# Patient Record
Sex: Male | Born: 1980 | Race: Black or African American | Hispanic: No | Marital: Single | Smoking: Current every day smoker
Health system: Southern US, Community
[De-identification: ages and names within clinical notes are randomized; demographics above are authoritative.]

## PROBLEM LIST (undated history)

## (undated) DIAGNOSIS — K222 Esophageal obstruction: Secondary | ICD-10-CM

## (undated) HISTORY — PX: ESOPHAGEAL DILATION: SHX303

## (undated) HISTORY — PX: MANDIBLE FRACTURE SURGERY: SHX706

---

## 2011-10-14 ENCOUNTER — Emergency Department (INDEPENDENT_AMBULATORY_CARE_PROVIDER_SITE_OTHER): Payer: Managed Care, Other (non HMO)

## 2011-10-14 ENCOUNTER — Emergency Department (HOSPITAL_BASED_OUTPATIENT_CLINIC_OR_DEPARTMENT_OTHER)
Admission: EM | Admit: 2011-10-14 | Discharge: 2011-10-15 | Disposition: A | Discharge status: Home / Self Care | Payer: Managed Care, Other (non HMO) | Attending: Emergency Medicine | Admitting: Emergency Medicine

## 2011-10-14 ENCOUNTER — Encounter (HOSPITAL_BASED_OUTPATIENT_CLINIC_OR_DEPARTMENT_OTHER): Payer: Self-pay | Admitting: *Deleted

## 2011-10-14 DIAGNOSIS — R1110 Vomiting, unspecified: Secondary | ICD-10-CM | POA: Insufficient documentation

## 2011-10-14 DIAGNOSIS — R6889 Other general symptoms and signs: Secondary | ICD-10-CM | POA: Insufficient documentation

## 2011-10-14 DIAGNOSIS — J00 Acute nasopharyngitis [common cold]: Secondary | ICD-10-CM

## 2011-10-14 DIAGNOSIS — R059 Cough, unspecified: Secondary | ICD-10-CM | POA: Insufficient documentation

## 2011-10-14 DIAGNOSIS — J3489 Other specified disorders of nose and nasal sinuses: Secondary | ICD-10-CM | POA: Insufficient documentation

## 2011-10-14 DIAGNOSIS — F17200 Nicotine dependence, unspecified, uncomplicated: Secondary | ICD-10-CM | POA: Insufficient documentation

## 2011-10-14 DIAGNOSIS — J069 Acute upper respiratory infection, unspecified: Secondary | ICD-10-CM

## 2011-10-14 DIAGNOSIS — R05 Cough: Secondary | ICD-10-CM | POA: Insufficient documentation

## 2011-10-14 DIAGNOSIS — J45901 Unspecified asthma with (acute) exacerbation: Secondary | ICD-10-CM | POA: Insufficient documentation

## 2011-10-14 DIAGNOSIS — R0602 Shortness of breath: Secondary | ICD-10-CM

## 2011-10-14 MED ORDER — PREDNISONE 50 MG PO TABS
60.0000 mg | ORAL_TABLET | Freq: Once | ORAL | Status: AC
  Administered 2011-10-15: 60 mg via ORAL
  Filled 2011-10-14: qty 1

## 2011-10-14 MED ORDER — ALBUTEROL SULFATE HFA 108 (90 BASE) MCG/ACT IN AERS: 1.0000 | INHALATION_SPRAY | Freq: Four times a day (QID) | RESPIRATORY_TRACT | Status: AC | PRN

## 2011-10-14 MED ORDER — ALBUTEROL SULFATE (5 MG/ML) 0.5% IN NEBU
5.0000 mg | INHALATION_SOLUTION | Freq: Once | RESPIRATORY_TRACT | Status: AC
  Administered 2011-10-14: 5 mg via RESPIRATORY_TRACT
  Filled 2011-10-14: qty 1

## 2011-10-14 NOTE — Discharge Instructions (Signed)
Asthma, Adult Asthma is caused by narrowing of the air passages in the lungs. It may be triggered by pollen, dust, animal dander, molds, some foods, respiratory infections, exposure to smoke, exercise, emotional stress or other allergens (things that cause allergic reactions or allergies). Repeat attacks are common. HOME CARE INSTRUCTIONS   Use prescription medications as ordered by your caregiver.   Avoid pollen, dust, animal dander, molds, smoke and other things that cause attacks at home and at work.   You may have fewer attacks if you decrease dust in your home. Electrostatic air cleaners may help.   It may help to replace your pillows or mattress with materials less likely to cause allergies.   Talk to your caregiver about an action plan for managing asthma attacks at home, including, the use of a peak flow meter which measures the severity of your asthma attack. An action plan can help minimize or stop the attack without having to seek medical care.   If you are not on a fluid restriction, drink 8 to 10 glasses of water each day.   Always have a plan prepared for seeking medical attention, including, calling your physician, accessing local emergency care, and calling 911 (in the [place] for a severe attack.   Discuss possible exercise routines with your caregiver.   If animal dander is the cause of asthma, you may need to get rid of pets.  SEEK MEDICAL CARE IF:   You have wheezing and shortness of breath even if taking medicine to prevent attacks.   You have muscle aches, chest pain or thickening of sputum.   Your sputum changes from clear or white to yellow, green, gray, or bloody.   You have any problems that may be related to the medicine you are taking (such as a rash, itching, swelling or trouble breathing).  SEEK IMMEDIATE MEDICAL CARE IF:   Your usual medicines do not stop your wheezing or there is increased coughing and/or shortness of breath.   You have increased  difficulty breathing.   You have a fever.  MAKE SURE YOU:   Understand these instructions.   Will watch your condition.   Will get help right away if you are not doing well or get worse.  Document Released: 08/07/2005 Document Revised: 04/19/2011 Document Reviewed: 03/25/2008 [facility] Patient Information 2012 [place], [place].Upper Respiratory Infection, Adult An upper respiratory infection (URI) is also known as the common cold. It is often caused by a type of germ (virus). Colds are easily spread (contagious). You can pass it to others by kissing, coughing, sneezing, or drinking out of the same glass. Usually, you get better in 1 or 2 weeks.  HOME CARE   Only take medicine as told by your doctor.   Use a warm mist humidifier or breathe in steam from a hot shower.   Drink enough water and fluids to keep your pee (urine) clear or pale yellow.   Get plenty of rest.   Return to work when your temperature is back to normal or as told by your doctor. You may use a face mask and wash your hands to stop your cold from spreading.  GET HELP RIGHT AWAY IF:   After the first few days, you feel you are getting worse.   You have questions about your medicine.   You have chills, shortness of breath, or brown or red spit (mucus).   You have yellow or brown snot (nasal discharge) or pain in the face, especially when you bend  forward.   You have a fever, puffy (swollen) neck, pain when you swallow, or white spots in the back of your throat.   You have a bad headache, ear pain, sinus pain, or chest pain.   You have a high-pitched whistling sound when you breathe in and out (wheezing).   You have a lasting cough or cough up blood.   You have sore muscles or a stiff neck.  MAKE SURE YOU:   Understand these instructions.   Will watch your condition.   Will get help right away if you are not doing well or get worse.  Document Released: 01/24/2008 Document Revised: 04/19/2011 Document  Reviewed: 12/12/2010 [facility] Patient Information 2012 [place], [place].

## 2011-10-14 NOTE — ED Notes (Signed)
Pt presents to ED today with URI sx for the last 2 weeks.  Pt was seen by PMD and dx with sinus infection and URI.  Pt here for asthma sx.

## 2011-10-14 NOTE — ED Provider Notes (Signed)
History     CSN: [id number]  Arrival date & time 10/14/11  2306   First MD Initiated Contact with Patient 10/14/11 2345      Chief Complaint  Patient presents with  . URI    (Consider location/radiation/quality/duration/timing/severity/associated sxs/prior treatment) HPI Comments: Patient presents today with a two-week history of runny nose cough and congestion. He started running fevers 2 days ago. He's also had some increased wheezing and trouble with his asthma over the last few days. He was seen at an urgent care center earlier today and diagnosed with a sinus infection and upper respiratory infection was given prescriptions for amoxicillin and, Robitussin a.c., and prednisone. However he has not started any of these medicines yet. He comes in tonight with increased shortness of breath and wheezing related to his asthma. He did use 2 puffs of his inhaler prior to arrival he actually feeling better at this point. Denies any chest pain. He has had some posttussive emesis. He has cough productive of white-yellow sputum.  The history is provided by the patient.    Past Medical History  Diagnosis Date  . Asthma     History reviewed. No pertinent past surgical history.  History reviewed. No pertinent family history.  History  Substance Use Topics  . Smoking status: Current Everyday Smoker -- 0.5 packs/day  . Smokeless tobacco: Not on file  . Alcohol Use: 0.0 oz/week    1-2 Cans of beer per week      Review of Systems  Constitutional: Positive for fever and fatigue. Negative for chills and diaphoresis.  HENT: Positive for congestion, rhinorrhea and sneezing.   Eyes: Negative.   Respiratory: Positive for cough and shortness of breath. Negative for chest tightness.   Cardiovascular: Negative for chest pain and leg swelling.  Gastrointestinal: Negative for nausea, vomiting, abdominal pain, diarrhea and blood in stool.  Genitourinary: Negative for frequency, hematuria, flank  pain and difficulty urinating.  Musculoskeletal: Negative for back pain and arthralgias.  Skin: Negative for rash.  Neurological: Negative for dizziness, speech difficulty, weakness, numbness and headaches.    Allergies  Review of patient's allergies indicates no known allergies.  Home Medications   Current Outpatient Rx  Name Route Sig Dispense Refill  . ALBUTEROL SULFATE HFA 108 (90 BASE) MCG/ACT IN AERS Inhalation Inhale 2 puffs into the lungs every 6 (six) hours as needed. For shortness of breath or wheezing     . AMOXICILLIN 875 MG PO TABS Oral Take 875 mg by mouth 2 (two) times daily. For 10 days starting 10/15/2011    . GUAIFENESIN-CODEINE 100-10 MG/5ML PO SYRP Oral Take 5 mLs by mouth every 6 (six) hours as needed. For cough     . PREDNISONE 10 MG PO TABS Oral Take 10 mg by mouth 3 (three) times daily. For 5 days starting 10/15/2011    . ALBUTEROL SULFATE HFA 108 (90 BASE) MCG/ACT IN AERS Inhalation Inhale 1-2 puffs into the lungs every 6 (six) hours as needed for wheezing. 1 Inhaler 0    BP 129/71  Pulse 103  Temp(Src) 98.5 F (36.9 C) (Oral)  Resp 19  Ht 5\' 8"  (1.727 m)  Wt 138 lb (62.596 kg)  BMI 20.98 kg/m2  SpO2 100%  Physical Exam  Constitutional: He is oriented to person, place, and time. He appears well-developed and well-nourished.  HENT:  Head: Normocephalic and atraumatic.  Right Ear: External ear normal.  Left Ear: External ear normal.  Mouth/Throat: Oropharynx is clear and moist.  Rhinorrhea present  Eyes: Pupils are equal, round, and reactive to light.  Neck: Normal range of motion. Neck supple.  Cardiovascular: Normal rate, regular rhythm and normal heart sounds.   Pulmonary/Chest: Effort normal. No respiratory distress. He has wheezes. He has no rales. He exhibits no tenderness.       Mildly decreased breath sounds bilaterally with end expiratory wheezes. No accessory muscle use is noted. Patient is talking in full sentences.  Abdominal: Soft.  Bowel sounds are normal. There is no tenderness. There is no rebound and no guarding.  Musculoskeletal: Normal range of motion. He exhibits no edema.  Lymphadenopathy:    He has no cervical adenopathy.  Neurological: He is alert and oriented to person, place, and time.  Skin: Skin is warm and dry. No rash noted.  Psychiatric: He has a normal mood and affect.    ED Course  Procedures (including critical care time)  Labs Reviewed - No data to display Dg Chest 2 View  10/14/2011  *RADIOLOGY REPORT*  Clinical Data: 31 year old male with cough, cold, shortness of breath.  URI.  CHEST - 2 VIEW  Comparison: None.  Findings: Normal lung volumes.  Cardiac size and mediastinal contours are within normal limits.  Visualized tracheal air column is within normal limits.  The lungs are clear except for mild diffuse increased interstitial markings.  No effusion. No acute osseous abnormality identified.  IMPRESSION: Negative except for mild diffuse increased interstitial markings such that viral / atypical respiratory infection is difficult to exclude.  Original Report Authenticated By: Ulla Potash III, M.D.     1. Asthma exacerbation   2. URI (upper respiratory infection)       MDM  Patient is well-appearing with no evidence of respiratory distress. There is no evidence of pneumonia. Although there is some increased interstitial markings which could represent atypical infection but is more likely viral. Patient was given one nebulizer treatment here as well as a dose of prednisone. He was advised to start taking the prescriptions as prescribed by the urgent care. He was advised to return here for any worsening symptoms or also encouraged to be seen if he has no improvement within the next few days        Rolan Bucco, MD 10/15/11 0001

## 2011-10-14 NOTE — ED Notes (Signed)
Pt has an hx of asthma and has been sick with a cold and takes MDI Albuterol and steroids but presented with [facility] and has a stuffy nose. Pt is clear bilaterally and is in no distress.

## 2013-01-30 IMAGING — CR DG CHEST 2V
2 series · 2 of 2 positions shown · non-contrast
Comparison: None.

CLINICAL DATA: 30-year-old male with cough, cold, shortness of
breath.  URI.

CHEST - 2 VIEW

[w chest pa]
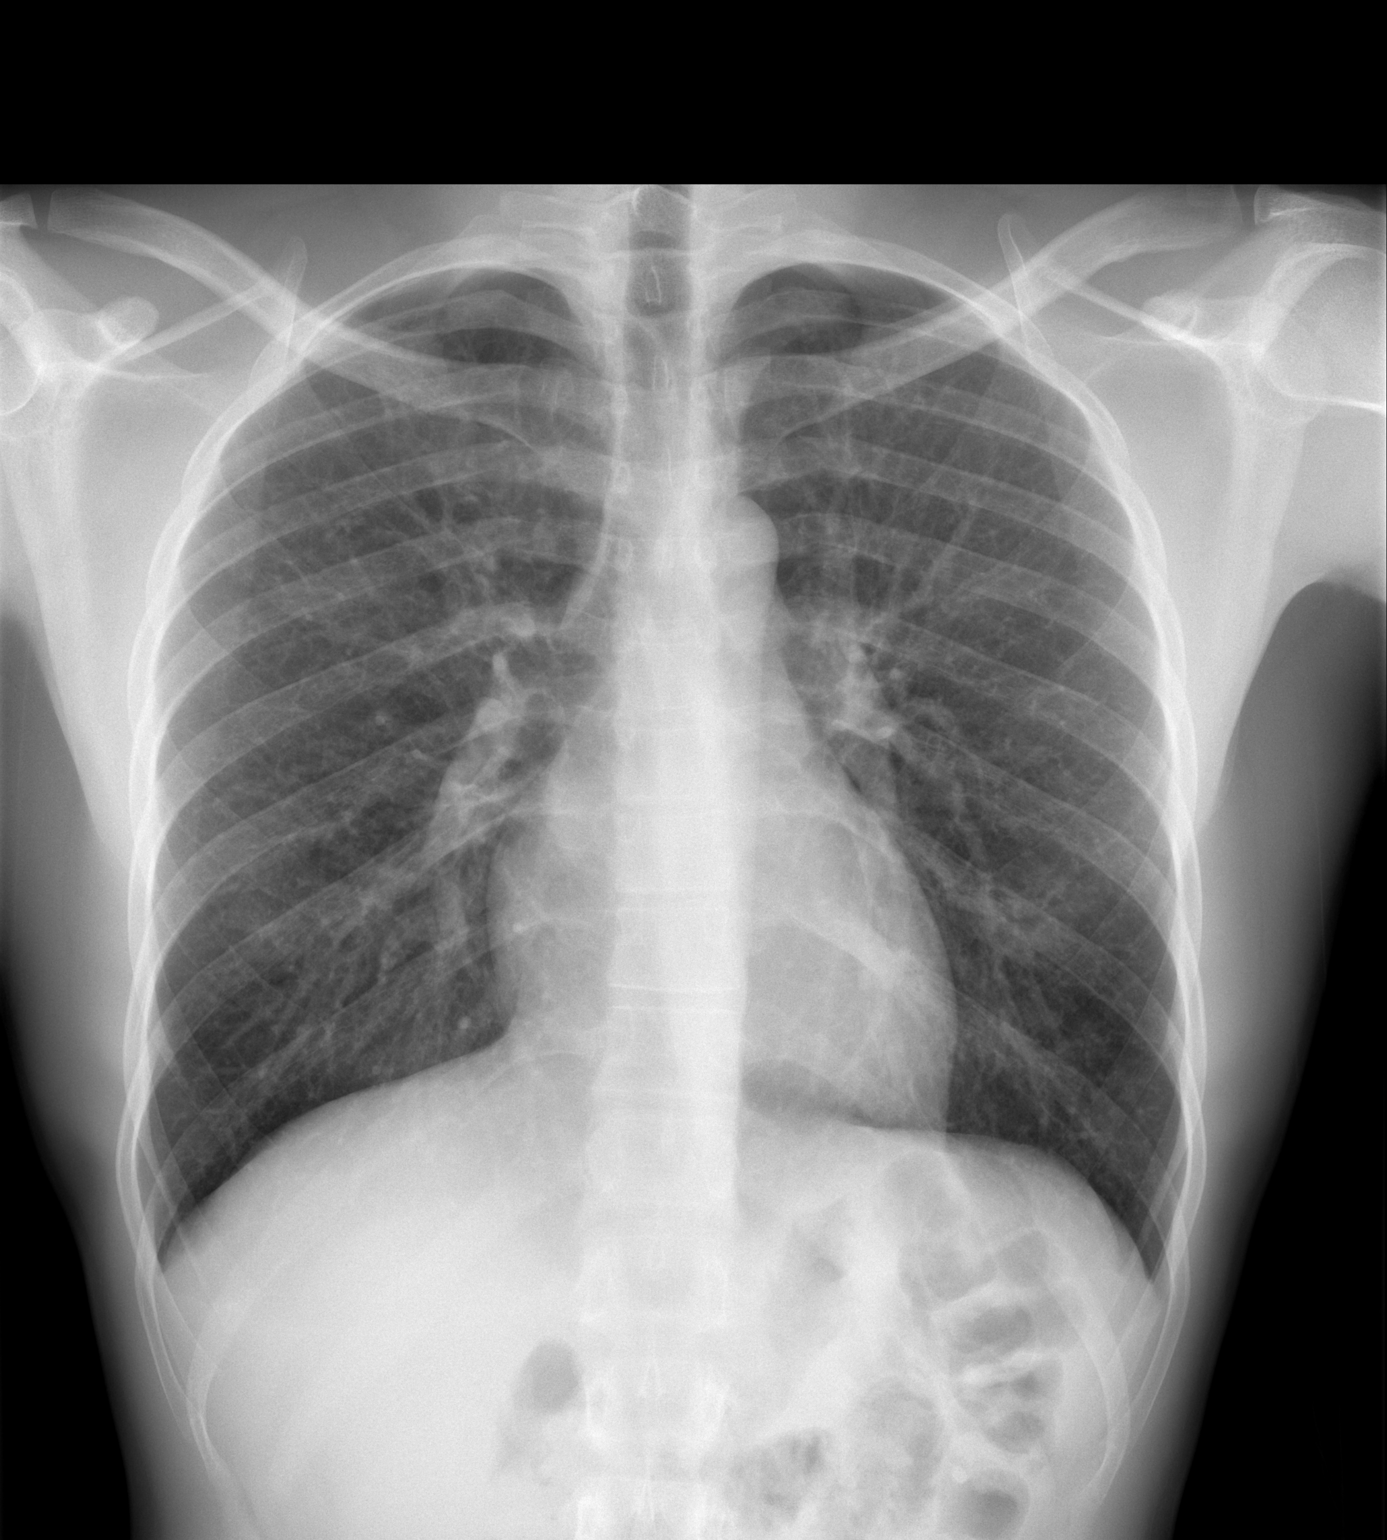

[w chest lat]
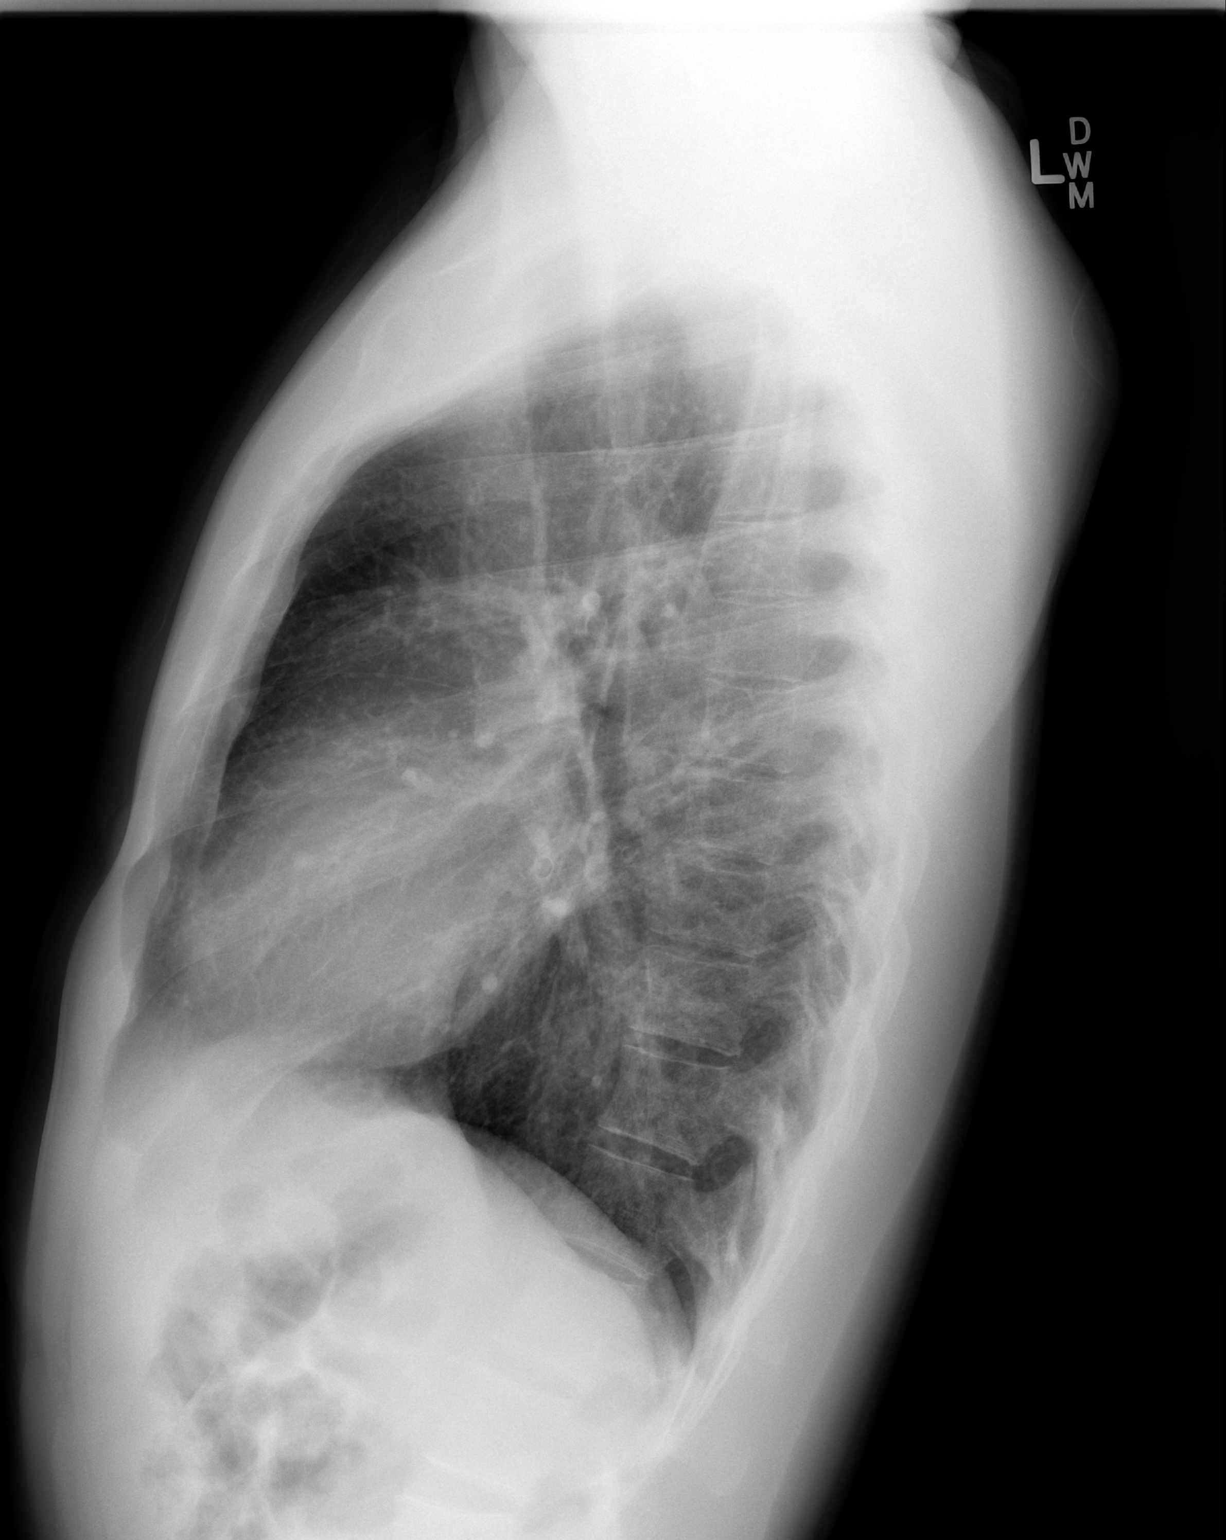

[2 of 2 positions shown; findings below may reference images not displayed]

FINDINGS: Normal lung volumes.  Cardiac size and mediastinal
contours are within normal limits.  Visualized tracheal air column
is within normal limits.  The lungs are clear except for mild
diffuse increased interstitial markings.  No effusion. No acute
osseous abnormality identified.
IMPRESSION: Negative except for mild diffuse increased interstitial markings
such that viral / atypical respiratory infection is difficult to
exclude.

## 2015-09-10 ENCOUNTER — Emergency Department (HOSPITAL_BASED_OUTPATIENT_CLINIC_OR_DEPARTMENT_OTHER)
Admission: EM | Admit: 2015-09-10 | Discharge: 2015-09-10 | Discharge status: Against Medical Advice | Payer: Managed Care, Other (non HMO) | Attending: Emergency Medicine | Admitting: Emergency Medicine

## 2015-09-10 ENCOUNTER — Encounter (HOSPITAL_BASED_OUTPATIENT_CLINIC_OR_DEPARTMENT_OTHER): Payer: Self-pay | Admitting: *Deleted

## 2015-09-10 DIAGNOSIS — J45909 Unspecified asthma, uncomplicated: Secondary | ICD-10-CM | POA: Insufficient documentation

## 2015-09-10 DIAGNOSIS — F17200 Nicotine dependence, unspecified, uncomplicated: Secondary | ICD-10-CM | POA: Insufficient documentation

## 2015-09-10 DIAGNOSIS — R1013 Epigastric pain: Secondary | ICD-10-CM | POA: Insufficient documentation

## 2015-09-10 DIAGNOSIS — R1033 Periumbilical pain: Secondary | ICD-10-CM | POA: Insufficient documentation

## 2015-09-10 LAB — CBC WITH DIFFERENTIAL/PLATELET
BASOS ABS: 0.1 10*3/uL (ref 0.0–0.1)
Basophils Relative: 1 %
EOS ABS: 0.2 10*3/uL (ref 0.0–0.7)
Eosinophils Relative: 4 %
HCT: 43.4 % (ref 39.0–52.0)
Hemoglobin: 14.1 g/dL (ref 13.0–17.0)
LYMPHS ABS: 1.2 10*3/uL (ref 0.7–4.0)
Lymphocytes Relative: 23 %
MCH: 24.2 pg — ABNORMAL LOW (ref 26.0–34.0)
MCHC: 32.5 g/dL (ref 30.0–36.0)
MCV: 74.6 fL — ABNORMAL LOW (ref 78.0–100.0)
MONO ABS: 0.3 10*3/uL (ref 0.1–1.0)
Monocytes Relative: 5 %
Neutro Abs: 3.4 10*3/uL (ref 1.7–7.7)
Neutrophils Relative %: 67 %
PLATELETS: 225 10*3/uL (ref 150–400)
RBC: 5.82 MIL/uL — AB (ref 4.22–5.81)
RDW: 15 % (ref 11.5–15.5)
WBC: 5.2 10*3/uL (ref 4.0–10.5)

## 2015-09-10 LAB — COMPREHENSIVE METABOLIC PANEL
ALBUMIN: 4.7 g/dL (ref 3.5–5.0)
ALT: 18 U/L (ref 17–63)
AST: 28 U/L (ref 15–41)
Alkaline Phosphatase: 48 U/L (ref 38–126)
Anion gap: 9 (ref 5–15)
BUN: 9 mg/dL (ref 6–20)
CHLORIDE: 105 mmol/L (ref 101–111)
CO2: 26 mmol/L (ref 22–32)
Calcium: 9.3 mg/dL (ref 8.9–10.3)
Creatinine, Ser: 0.97 mg/dL (ref 0.61–1.24)
GFR calc Af Amer: >60 mL/min (ref >60)
GLUCOSE: 84 mg/dL (ref 65–99)
POTASSIUM: 3.9 mmol/L (ref 3.5–5.1)
Sodium: 140 mmol/L (ref 135–145)
Total Bilirubin: 0.7 mg/dL (ref 0.3–1.2)
Total Protein: 7.4 g/dL (ref 6.5–8.1)

## 2015-09-10 LAB — LIPASE, BLOOD: LIPASE: 23 U/L (ref 11–51)

## 2015-09-10 NOTE — ED Notes (Signed)
Pt amb to room 8 with quick steady gait , states "I am having the worst stomach pain of my life." pt indicates his epigastric/supraumbilical area, tender to palp, denies any fever/n/v/d, last bm this am. "normal".

## 2015-09-10 NOTE — ED Notes (Signed)
Bryce Duarte, registration states she saw pt leave ER amb with his male companion.

## 2015-09-10 NOTE — ED Provider Notes (Signed)
Patient left prior to being evaluated by the emergency physician.  Loren Racer, MD 09/10/15 1235

## 2015-09-10 NOTE — ED Notes (Signed)
Pt called in waiting area and in front of ER, no response.

## 2015-09-10 NOTE — ED Notes (Signed)
Pt called again in waiting area, outside ER and in restrooms. Attempted cell phone again, no answer.

## 2015-09-10 NOTE — ED Notes (Signed)
Pt not in room, gown noted on bed. Pt called in waiting area and restrooms, no answer. IV not in trash can or visible in sharps container. This rn attempts to call pt cell phone number, no answer.

## 2015-09-10 NOTE — ED Notes (Signed)
This rn attempts to call pt mother, left message for her to call me back asap.

## 2016-08-31 ENCOUNTER — Encounter (HOSPITAL_BASED_OUTPATIENT_CLINIC_OR_DEPARTMENT_OTHER): Payer: Self-pay | Admitting: Emergency Medicine

## 2016-08-31 ENCOUNTER — Emergency Department (HOSPITAL_BASED_OUTPATIENT_CLINIC_OR_DEPARTMENT_OTHER)
Admission: EM | Admit: 2016-08-31 | Discharge: 2016-08-31 | Disposition: A | Discharge status: Home / Self Care | Payer: Managed Care, Other (non HMO) | Attending: Emergency Medicine | Admitting: Emergency Medicine

## 2016-08-31 DIAGNOSIS — J45909 Unspecified asthma, uncomplicated: Secondary | ICD-10-CM | POA: Insufficient documentation

## 2016-08-31 DIAGNOSIS — Y929 Unspecified place or not applicable: Secondary | ICD-10-CM | POA: Insufficient documentation

## 2016-08-31 DIAGNOSIS — X58XXXA Exposure to other specified factors, initial encounter: Secondary | ICD-10-CM | POA: Insufficient documentation

## 2016-08-31 DIAGNOSIS — Y999 Unspecified external cause status: Secondary | ICD-10-CM | POA: Insufficient documentation

## 2016-08-31 DIAGNOSIS — K222 Esophageal obstruction: Secondary | ICD-10-CM

## 2016-08-31 DIAGNOSIS — T18128A Food in esophagus causing other injury, initial encounter: Secondary | ICD-10-CM | POA: Insufficient documentation

## 2016-08-31 DIAGNOSIS — F17200 Nicotine dependence, unspecified, uncomplicated: Secondary | ICD-10-CM | POA: Insufficient documentation

## 2016-08-31 DIAGNOSIS — Y9389 Activity, other specified: Secondary | ICD-10-CM | POA: Insufficient documentation

## 2016-08-31 HISTORY — DX: Esophageal obstruction: K22.2

## 2016-08-31 MED ORDER — ONDANSETRON HCL 4 MG/2ML IJ SOLN
4.0000 mg | Freq: Once | INTRAMUSCULAR | Status: AC
  Administered 2016-08-31: 4 mg via INTRAVENOUS
  Filled 2016-08-31: qty 2

## 2016-08-31 MED ORDER — SUCRALFATE 1 G PO TABS: 1.0000 g | ORAL_TABLET | Freq: Three times a day (TID) | ORAL | 0 refills | Status: AC

## 2016-08-31 MED ORDER — GI COCKTAIL ~~LOC~~
30.0000 mL | Freq: Once | ORAL | Status: AC
  Administered 2016-08-31: 30 mL via ORAL
  Filled 2016-08-31: qty 30

## 2016-08-31 MED ORDER — NITROGLYCERIN 0.4 MG SL SUBL
0.4000 mg | SUBLINGUAL_TABLET | Freq: Once | SUBLINGUAL | Status: AC
  Administered 2016-08-31: 0.4 mg via SUBLINGUAL
  Filled 2016-08-31: qty 1

## 2016-08-31 MED ORDER — PANTOPRAZOLE SODIUM 40 MG PO TBEC: 40.0000 mg | DELAYED_RELEASE_TABLET | Freq: Every day | ORAL | 0 refills | Status: AC

## 2016-08-31 MED ORDER — GLUCAGON HCL RDNA (DIAGNOSTIC) 1 MG IJ SOLR
1.0000 mg | Freq: Once | INTRAMUSCULAR | Status: AC
  Administered 2016-08-31: 1 mg via INTRAVENOUS
  Filled 2016-08-31: qty 1

## 2016-08-31 MED ORDER — SODIUM CHLORIDE 0.9 % IV SOLN
INTRAVENOUS | Status: DC
  Administered 2016-08-31: 02:00:00 via INTRAVENOUS

## 2016-08-31 MED ORDER — PANTOPRAZOLE SODIUM 40 MG IV SOLR
40.0000 mg | Freq: Once | INTRAVENOUS | Status: AC
  Administered 2016-08-31: 40 mg via INTRAVENOUS
  Filled 2016-08-31: qty 40

## 2016-08-31 MED ORDER — ONDANSETRON 4 MG PO TBDP: 4.0000 mg | ORAL_TABLET | Freq: Three times a day (TID) | ORAL | 0 refills | Status: AC | PRN

## 2016-08-31 NOTE — ED Triage Notes (Signed)
Ate piece of meat at approx 1230 this morning and it got stuck.  Is not able to swallow anything now.  Is spitting constantly. Hx of Esophageal stricture.

## 2016-08-31 NOTE — ED Provider Notes (Signed)
TIME SEEN: 2:15 AM  CHIEF COMPLAINT: Esophageal food bolus  HPI: Pt is a 36 y.o. male with history of asthma and esophageal stricture that required stretching 2 years ago at [facility] who presents emergency department with esophageal food bolus. Reports a 12:30 AM he was eating stew beef and feels that it is struck in his esophagus. He has not been able to swallow his saliva and is spinning, gagging. No vomiting, difficulty breathing or speaking. He does not remember the name of the gastroenterologist that he saw. Has not tried any medications prior to arrival.  ROS: See HPI Constitutional: no fever  Eyes: no drainage  ENT: no runny nose   Cardiovascular:  no chest pain  Resp: no SOB  GI: no vomiting GU: no dysuria Integumentary: no rash  Allergy: no hives  Musculoskeletal: no leg swelling  Neurological: no slurred speech ROS otherwise negative  PAST MEDICAL HISTORY/PAST SURGICAL HISTORY:  Past Medical History:  Diagnosis Date  . Asthma   . Esophageal stricture     MEDICATIONS:  Prior to Admission medications   Medication Sig Start Date End Date Taking? Authorizing Provider  albuterol (PROVENTIL HFA;VENTOLIN HFA) 108 (90 BASE) MCG/ACT inhaler Inhale 1-2 puffs into the lungs every 6 (six) hours as needed for wheezing. 10/14/11 10/13/12  Rolan BuccoMelanie Belfi, MD    ALLERGIES:  No Known Allergies  SOCIAL HISTORY:  Social History  Substance Use Topics  . Smoking status: Current Every Day Smoker    Packs/day: 0.50  . Smokeless tobacco: Never Used  . Alcohol use 0.0 oz/week    1 - 2 Cans of beer per week    FAMILY HISTORY: No family history on file.  EXAM: BP 128/88 (BP Location: Right Arm)   Pulse 110   Temp 99.2 F (37.3 C) (Oral)   Resp 16   Ht 5\' 8"  (1.727 m)   Wt 145 lb (65.8 kg)   SpO2 97%   BMI 22.05 kg/m  CONSTITUTIONAL: Alert and oriented and responds appropriately to questions. Appears uncomfortable, actively spitting into an emesis  bag HEAD: Normocephalic EYES: Conjunctivae clear, PERRL, EOMI ENT: normal nose; no rhinorrhea; moist mucous membranes; No pharyngeal erythema or petechiae, no tonsillar hypertrophy or exudate, no uvular deviation, no unilateral swelling, no trismus or drooling, patient unable to swallow his own saliva, no muffled voice, normal phonation, no stridor, no dental caries present, no drainable dental abscess noted, no Ludwig's angina, tongue sits flat in the bottom of the mouth, no angioedema, no facial erythema or warmth, no facial swelling; no pain with movement of the neck NECK: Supple, no meningismus, no nuchal rigidity, no LAD  CARD: Regular and tachycardic; S1 and S2 appreciated; no murmurs, no clicks, no rubs, no gallops RESP: Normal chest excursion without splinting or tachypnea; breath sounds clear and equal bilaterally; no wheezes, no rhonchi, no rales, no hypoxia or respiratory distress, speaking full sentences ABD/GI: Normal bowel sounds; non-distended; soft, non-tender, no rebound, no guarding, no peritoneal signs, no hepatosplenomegaly BACK:  The back appears normal and is non-tender to palpation, there is no CVA tenderness EXT: Normal ROM in all joints; non-tender to palpation; no edema; normal capillary refill; no cyanosis, no calf tenderness or swelling    SKIN: Normal color for age and race; warm; no rash NEURO: Moves all extremities equally, sensation to light touch intact diffusely, cranial nerves II through XII intact, normal speech PSYCH: The patient's mood and manner are appropriate. Grooming and personal hygiene are appropriate.  MEDICAL  DECISION MAKING: Patient here with esophageal food bolus. He is requesting that we try medications before sending him to [facility]. [facility] give Zofran, nitroglycerin and glucagon. I anticipate however given he has anatomic stricture that he will need endoscopy today.  On review of outside hospital records it appears patient was at [facility] June 2015. Unfortunately I am unable to see GI notes.  ED PROGRESS: 2:50 AM  Pt did not feel any significant relief after medications. We had him chug a Coca-Cola and he felt like that helped resolve his food bolus. Now able to swallow his own saliva and drinks without spitting fluid back up. States he is still having some discomfort in his lower esophagus but he feels like the food boluses gone. We'll give GI cocktail, Protonix and reassess. We'll continue to monitor patient in the ED.  3:15 AM  Pt reports feeling much better after GI cocktail and Protonix. Able to swallow without any difficulty. Will give outpatient GI follow-up information. We'll discharge with Protonix, Carafate and Zofran. Have recommended soft diet for the next several days. Discussed return precautions with patient. He verbalizes understanding and is comfortable with this plan.   At this time, I do not feel there is any life-threatening condition present. I have reviewed and discussed all results (EKG, imaging, lab, urine as appropriate) and exam findings with patient/family. I have reviewed nursing notes and appropriate previous records.  I feel the patient is safe to be discharged home without further emergent workup and can continue workup as an outpatient as needed. Discussed usual and customary return precautions. Patient/family verbalize understanding and are comfortable with this plan.  Outpatient follow-up has been provided. All questions have been answered.    Layla Maw Danial Hlavac, DO 08/31/16 [phone]
